# Patient Record
Sex: Male | Born: 1985 | Hispanic: Refuse to answer | Marital: Single | Smoking: Never smoker
Health system: Southern US, Community
[De-identification: ages and names within clinical notes are randomized; demographics above are authoritative.]

---

## 2004-01-13 ENCOUNTER — Emergency Department: Payer: Self-pay | Admitting: Emergency Medicine

## 2016-07-03 ENCOUNTER — Encounter: Payer: Self-pay | Admitting: *Deleted

## 2016-07-03 ENCOUNTER — Ambulatory Visit (INDEPENDENT_AMBULATORY_CARE_PROVIDER_SITE_OTHER): Payer: Self-pay

## 2016-07-03 ENCOUNTER — Ambulatory Visit
Admission: EM | Admit: 2016-07-03 | Discharge: 2016-07-03 | Disposition: A | Discharge status: Home / Self Care | Payer: Self-pay | Attending: Family Medicine | Admitting: Family Medicine

## 2016-07-03 DIAGNOSIS — S62326A Displaced fracture of shaft of fifth metacarpal bone, right hand, initial encounter for closed fracture: Secondary | ICD-10-CM

## 2016-07-03 NOTE — ED Triage Notes (Signed)
Patient injured right hand when he hit it against a step as he was falling backwards 3 days ago. Right Hand is visibly swollen medially.

## 2016-07-03 NOTE — ED Provider Notes (Signed)
CSN: [id number]     Arrival date & time 07/03/16  1232 History   First MD Initiated Contact with Patient 07/03/16 1407     Chief Complaint  Patient presents with  . Hand Injury   (Consider location/radiation/quality/duration/timing/severity/associated sxs/prior Treatment) HPI  This a 31 year old Hispanic male presents with a ten-day history of right ulnar hand pain. He states that he slipped on a step and fell backwards striking his right dominant ulnar hand on the step. He had immediate pain and a large amount of swelling and ecchymosis. 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 He presents today because of continued pain.        History reviewed. No pertinent past medical history. History reviewed. No pertinent surgical history. Family History  Problem Relation Age of Onset  . Cancer Mother   . Diabetes Father    Social History  Substance Use Topics  . Smoking status: Never Smoker  . Smokeless tobacco: Never Used  . Alcohol use Yes    Review of Systems  Constitutional: Negative for activity change, appetite change, chills, fatigue and fever.  Musculoskeletal: Positive for joint swelling.  All other systems reviewed and are negative.   Allergies  Patient has no known allergies.  Home Medications   Prior to Admission medications   Not on File   Meds Ordered and Administered this Visit  Medications - No data to display  BP (!) 110/58 (BP Location: Left Arm)   Pulse (!) 53   Temp 97.9 F (36.6 C) (Oral)   Resp 16   Ht 5\' 7"  (1.702 m)   Wt 190 lb (86.2 kg)   SpO2 99%   BMI 29.76 kg/m  No data found.   Physical Exam  Constitutional: He is oriented to person, place, and time. He appears well-developed and well-nourished. No distress.  HENT:  Head: Normocephalic.  Eyes: Pupils are equal, round, and reactive to light.  Neck: Normal range of motion.  Musculoskeletal: He exhibits edema and  tenderness. He exhibits no deformity.  Examination of the right dominant hand shows swelling over the ulnar mid metacarpal area of the fifth metacarpal. Patient does have a rotational deformity of approximately 45 of the little finger in flexion. Maximal tenderness is the mid shaft of the fifth metacarpal. Wrist range of motion is full and comfortable. Other fingers are not involved.  Neurological: He is alert and oriented to person, place, and time.  Skin: Skin is warm and dry. He is not diaphoretic.  Psychiatric: He has a normal mood and affect. His behavior is normal. Judgment and thought content normal.  Nursing note and vitals reviewed.   Urgent Care Course     Procedures (including critical care time)  Labs Review Labs Reviewed - No data to display  Imaging Review Dg Hand Complete Right  Result Date: 07/03/2016 CLINICAL DATA:  Tripped and fell injuring right hand 3 days ago. EXAM: RIGHT HAND - COMPLETE 3+ VIEW COMPARISON:  None. FINDINGS: Comminuted fracture of the mid fifth metacarpal with 3 mm radial distraction of the distal fragment and apex posterior angulation. No other fracture evident. No subluxation dislocation. IMPRESSION: Comminuted fracture mid fifth metacarpal. Electronically Signed   By: Kennith CenterEric  Mansell M.D.   On: 07/03/2016 13:17     Visual Acuity Review  Right Eye Distance:   Left Eye Distance:   Bilateral Distance:    Right Eye Near:   Left  Eye Near:    Bilateral Near:     Patient was placed in a gutter splint.    MDM   1. Closed displaced fracture of shaft of fifth metacarpal bone of right hand, initial encounter    Plan: 1. Test/x-ray results and diagnosis reviewed with patient 2. rx as per orders; risks, benefits, potential side effects reviewed with patient 3. Recommend supportive treatment with elevation/ice as necessary to control swelling. Patient will need to follow-up with a hand surgeon for possible surgery. He was given the address and phone  number for [facility] hand surgery. If he cannot be seen in a timely fashion he will need to go to the emergency department for evaluation and treatment. I recommended Motrin for pain. 4. F/u prn if symptoms worsen or don't improve     Lutricia Feil, PA-C 07/03/16 1511

## 2016-10-06 ENCOUNTER — Ambulatory Visit
Admission: EM | Admit: 2016-10-06 | Discharge: 2016-10-06 | Disposition: A | Discharge status: Home / Self Care | Payer: Self-pay | Attending: Family Medicine | Admitting: Family Medicine

## 2016-10-06 ENCOUNTER — Ambulatory Visit (INDEPENDENT_AMBULATORY_CARE_PROVIDER_SITE_OTHER): Payer: Self-pay

## 2016-10-06 ENCOUNTER — Encounter: Payer: Self-pay | Admitting: *Deleted

## 2016-10-06 DIAGNOSIS — M25571 Pain in right ankle and joints of right foot: Secondary | ICD-10-CM

## 2016-10-06 DIAGNOSIS — S92124A Nondisplaced fracture of body of right talus, initial encounter for closed fracture: Secondary | ICD-10-CM

## 2016-10-06 DIAGNOSIS — S8001XA Contusion of right knee, initial encounter: Secondary | ICD-10-CM

## 2016-10-06 DIAGNOSIS — M79604 Pain in right leg: Secondary | ICD-10-CM

## 2016-10-06 MED ORDER — NAPROXEN 500 MG PO TABS: 500.0000 mg | ORAL_TABLET | Freq: Two times a day (BID) | ORAL | 0 refills | Status: AC | PRN

## 2016-10-06 NOTE — ED Provider Notes (Signed)
[facility]    CSN: [id number] Arrival date & time: 10/06/16  1249     History   Chief Complaint Chief Complaint  Patient presents with  . Ankle Injury    HPI Anthony Spence Anthony Spence is a 31 y.o. male.   31 year old male presents with right leg and ankle injury that occurred 3 days ago. He was working on the 2nd floor of a house when he fell through the rafters and landed on his right leg and foot. He has swelling and bruising from medial aspect of knee to medial aspect of ankle. He has not applied any ice or taken any medication for pain or swelling. He continued working after his injury. No previous injuries or surgeries on right leg/foot. He is concerned over a fracture since his pain continues and has significant bruising. No other chronic health issues. Takes no daily medication.    The history is provided by the patient.    History reviewed. No pertinent past medical history.  There are no active problems to display for this patient.   History reviewed. No pertinent surgical history.     Home Medications    Prior to Admission medications   Medication Sig Start Date End Date Taking? Authorizing Provider  naproxen (NAPROSYN) 500 MG tablet Take 1 tablet (500 mg total) by mouth 2 (two) times daily as needed for moderate pain. 10/06/16   Sudie Grumbling, NP    Family History Family History  Problem Relation Age of Onset  . Cancer Mother   . Diabetes Father     Social History Social History  Substance Use Topics  . Smoking status: Never Smoker  . Smokeless tobacco: Never Used  . Alcohol use Yes     Allergies   Patient has no known allergies.   Review of Systems Review of Systems  Constitutional: Negative for activity change, appetite change, chills, fatigue and fever.  Eyes: Negative for photophobia, pain and visual disturbance.  Gastrointestinal: Negative for abdominal pain, diarrhea, nausea and vomiting.  Genitourinary: Negative for decreased  urine volume and difficulty urinating.  Musculoskeletal: Positive for arthralgias, joint swelling and myalgias. Negative for back pain and neck pain.  Skin: Positive for color change. Negative for rash and wound.  Allergic/Immunologic: Negative for immunocompromised state.  Neurological: Negative for dizziness, tremors, seizures, syncope, weakness, light-headedness, numbness and headaches.  Hematological: Negative for adenopathy. Does not bruise/bleed easily.     Physical Exam Triage Vital Signs ED Triage Vitals  Enc Vitals Group     BP 10/06/16 1305 120/67     Pulse Rate 10/06/16 1305 (!) 56     Resp 10/06/16 1305 16     Temp 10/06/16 1305 98.2 F (36.8 C)     Temp Source 10/06/16 1305 Oral     SpO2 10/06/16 1305 99 %     Weight 10/06/16 1306 180 lb (81.6 kg)     Height 10/06/16 1306  (1.702 m)     Head Circumference --      Peak Flow --      Pain Score 10/06/16 1308 5     Pain Loc --      Pain Edu? --      Excl. in GC? --    No data found.   Updated Vital Signs BP 120/67 (BP Location: Left Arm)   Pulse (!) 56   Temp 98.2 F (36.8 C) (Oral)   Resp 16   Ht  (1.702 m)  Wt 180 lb (81.6 kg)   SpO2 99%   BMI 28.19 kg/m   Visual Acuity Right Eye Distance:   Left Eye Distance:   Bilateral Distance:    Right Eye Near:   Left Eye Near:    Bilateral Near:     Physical Exam  Constitutional: He is oriented to person, place, and time. He appears well-developed and well-nourished. No distress.  HENT:  Head: Normocephalic and atraumatic.  Eyes: Conjunctivae and EOM are normal.  Neck: Normal range of motion. Neck supple.  Cardiovascular: Normal heart sounds.  Bradycardia present.   Pulmonary/Chest: Effort normal.  Musculoskeletal: He exhibits edema and tenderness.       Right knee: He exhibits swelling and ecchymosis. He exhibits normal range of motion, no deformity, no laceration, no erythema, normal alignment, no LCL laxity and normal patellar mobility.  Tenderness found. Medial joint line tenderness noted.       Right ankle: He exhibits swelling and ecchymosis. He exhibits normal range of motion, no deformity, no laceration and normal pulse. Tenderness. Medial malleolus tenderness found. Achilles tendon normal.       Legs:      Feet:  Right medial aspect of knee with red to purple ecchymosis present and tender. Has full range of motion of knee and leg. Tender and slight pink ecchymosis present mid-way down Tibia.  Has full range of motion of right ankle/foot but pain with flexion and rotation. Very swollen around medial malleolus and significant ecchymosis present below malleolus. Tender over dorsal aspect and medial aspect of foot. Good pulses and capillary refill. No neuro deficits noted.   Neurological: He is alert and oriented to person, place, and time. He has normal strength. No sensory deficit.  Skin: Skin is warm, dry and intact. Capillary refill takes less than 2 seconds. Bruising and ecchymosis noted. There is erythema.  Psychiatric: He has a normal mood and affect. His behavior is normal. Judgment and thought content normal.     UC Treatments / Results  Labs (all labs ordered are listed, but only abnormal results are displayed) Labs Reviewed - No data to display  EKG  EKG Interpretation None       Radiology Dg Tibia/fibula Right  Result Date: 10/06/2016 CLINICAL DATA:  Right lower leg pain.  Status post fall. EXAM: RIGHT TIBIA AND FIBULA - 2 VIEW COMPARISON:  None. FINDINGS: There is no evidence of fracture or other focal bone lesions. Soft tissues are unremarkable. IMPRESSION: No acute osseous injury of the right tibia or fibula. Electronically Signed   By: Elige KoHetal  Patel   On: 10/06/2016 13:58   Dg Ankle Complete Right  Result Date: 10/06/2016 CLINICAL DATA:  Right lower leg status post fall. EXAM: RIGHT ANKLE - COMPLETE 3+ VIEW COMPARISON:  None. FINDINGS: There is a subtle linear lucency along the periphery of the lateral  talus concerning for a nondisplaced fracture. There is no evidence of arthropathy or other focal bone abnormality. Soft tissue swelling around the ankle. IMPRESSION: Subtle linear lucency along the periphery of the lateral talus concerning for a nondisplaced fracture. No other acute fracture or dislocation. Electronically Signed   By: Elige KoHetal  Patel   On: 10/06/2016 14:01    Procedures Procedures (including critical care time)  Medications Ordered in UC Medications - No data to display   Initial Impression / Assessment and Plan / UC Course  I have reviewed the triage vital signs and the nursing notes.  Pertinent labs & imaging results that were available during  my care of the patient were reviewed by me and considered in my medical decision making (see chart for details).    Reviewed x-ray results with patient- no fracture of leg. Possible Small non-displaced fracture of Talus bone of right foot. Recommend wear ace wrap and Ortho boot for support. Keep foot elevated as much as possible. Take Naproxen  twice a day as needed for pain and swelling. Since this injury occurred at work- patient needs to follow-up with Occupational Health to be referred to [facility] or Orthopedic for management. Recommend follow-up with Tommie Maximiano Coss, NP tomorrow for further evaluation and probable referral to Podiatrist for further evaluation and treatment.   Final Clinical Impressions(s) / UC Diagnoses   Final diagnoses:  Closed nondisplaced fracture of body of right talus, initial encounter  Contusion of right knee, initial encounter  Leg pain, inferior, right  Acute right ankle pain    New Prescriptions Discharge Medication List as of 10/06/2016  2:34 PM    START taking these medications   Details  naproxen (NAPROSYN) 500 MG tablet Take 1 tablet (500 mg total) by mouth 2 (two) times daily as needed for moderate pain., Starting Sun 10/06/2016, Normal         Controlled Substance  Prescriptions [place] Controlled Substance Registry consulted? No   Sudie Grumbling, NP 10/06/16 [phone]

## 2016-10-06 NOTE — ED Triage Notes (Signed)
Patient injured his right ankle, right lower leg, and right knee 3 days ago when he fell through rafters on while working on the second floor. No previous surgeries on right leg or ankle.

## 2016-10-06 NOTE — Discharge Instructions (Signed)
Recommend wear ace wrap and boot on right foot for support. Keep foot elevated as much as possible. Take Naproxen 500mg  twice a day as needed for pain and swelling. Follow up with Tommie Maximiano CossAnne Moore, NP- Occupational Health tomorrow for referral to Podiatrist (Foot doctor) for further evaluation.

## 2018-09-28 IMAGING — CR DG ANKLE COMPLETE 3+V*R*
3 series · 3 of 3 positions shown · non-contrast
Comparison: None.

CLINICAL DATA: Right lower leg status post fall.

EXAM:
RIGHT ANKLE - COMPLETE 3+ VIEW

[ankle ap]
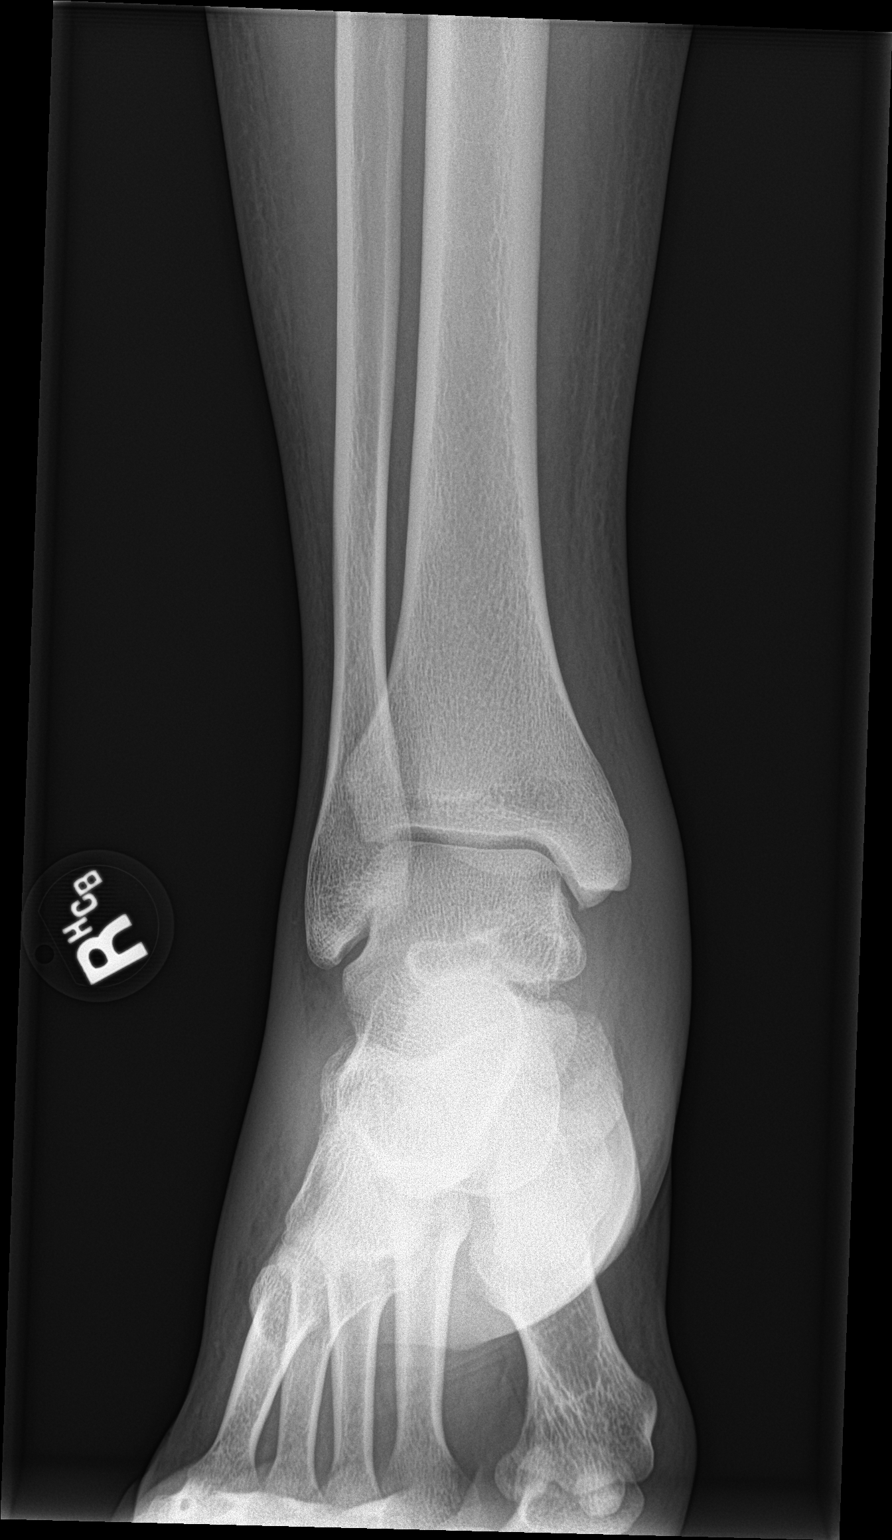

[ankle obl]
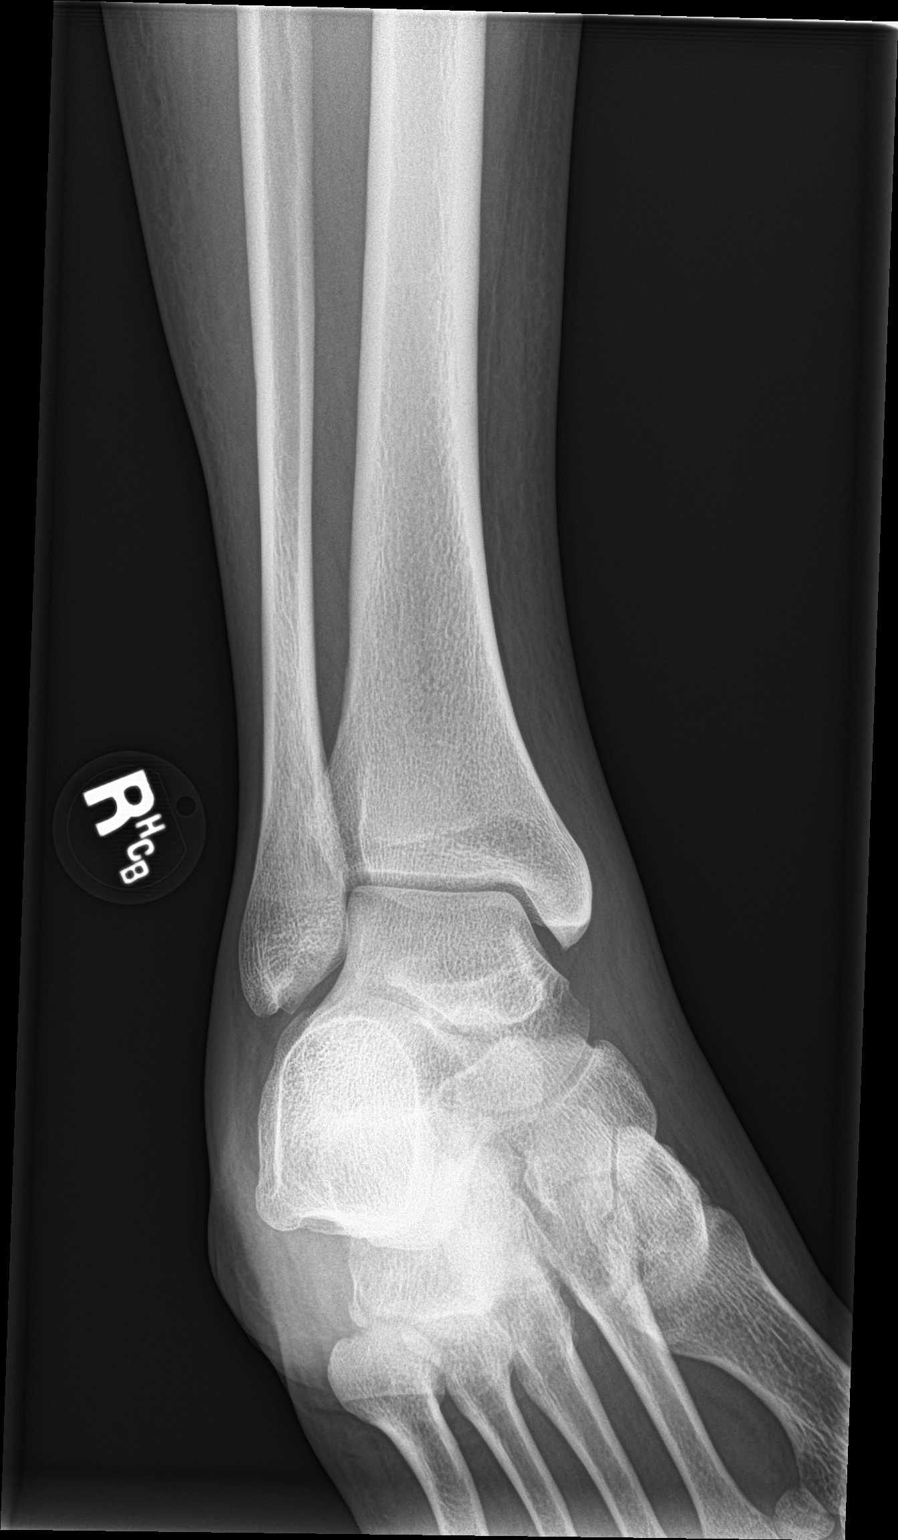

[ankle lat]
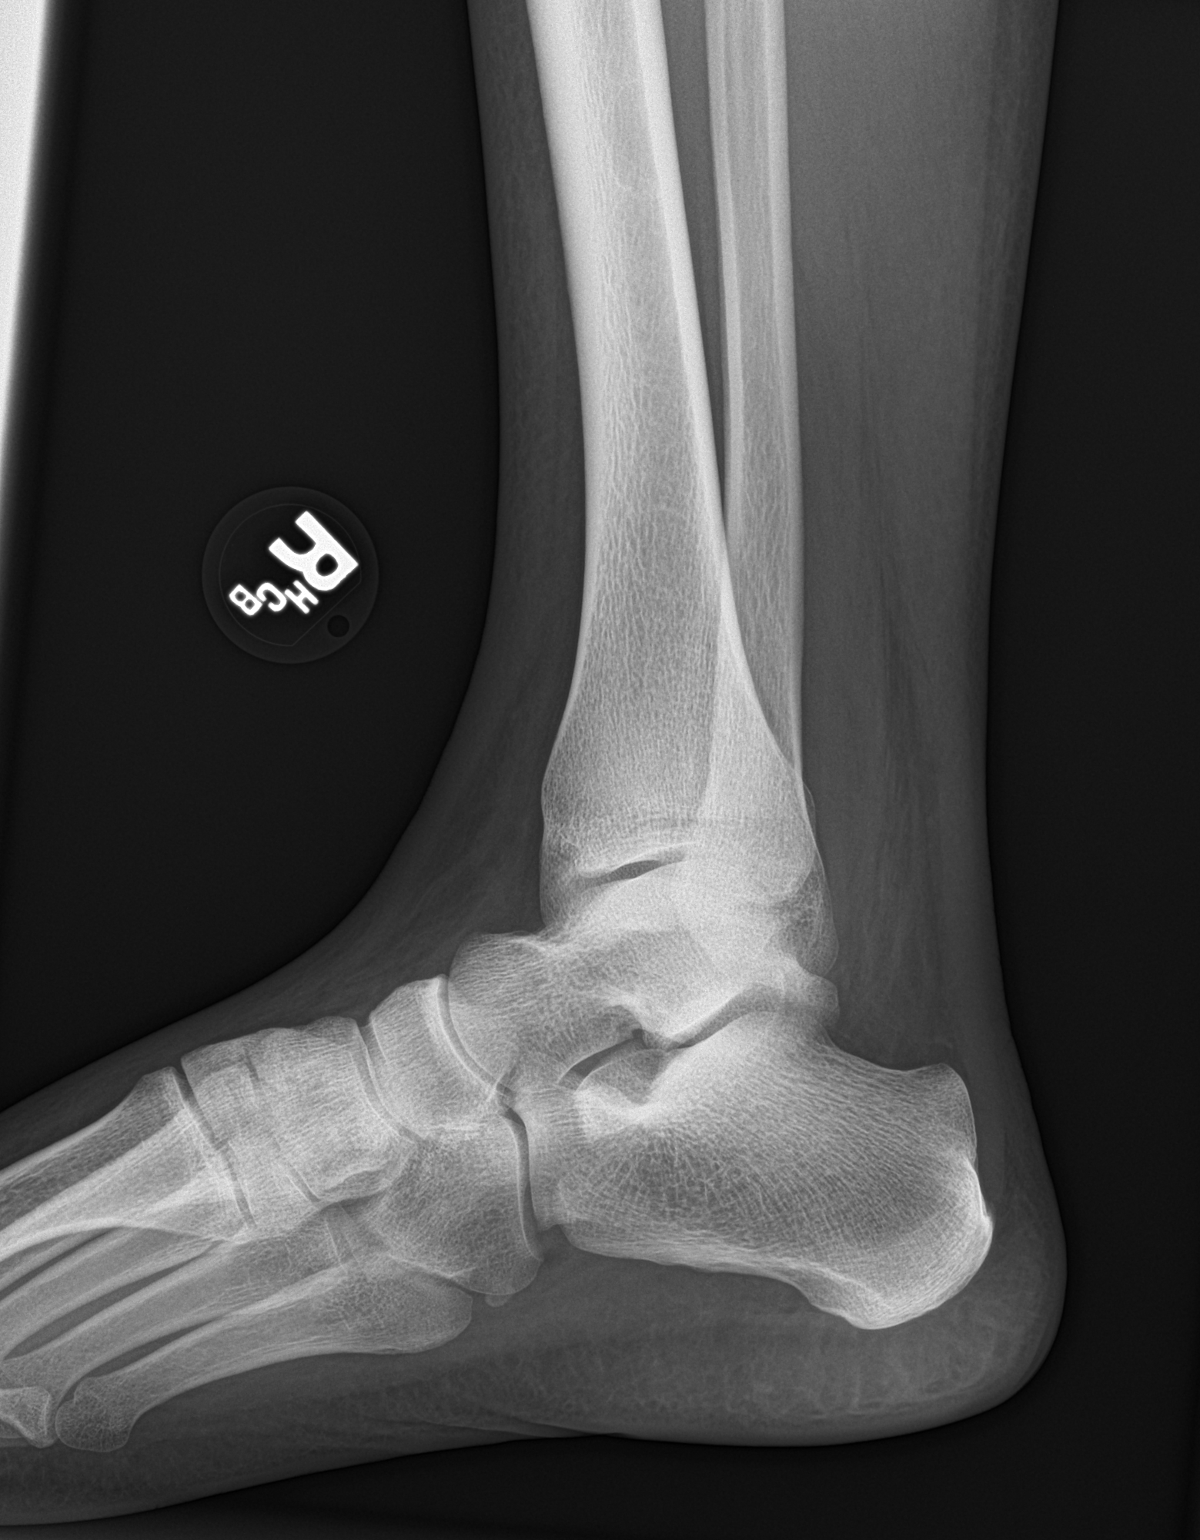

[3 of 3 positions shown; findings below may reference images not displayed]

FINDINGS: There is a subtle linear lucency along the periphery of the lateral
talus concerning for a nondisplaced fracture. There is no evidence
of arthropathy or other focal bone abnormality. Soft tissue swelling
around the ankle.
IMPRESSION: Subtle linear lucency along the periphery of the lateral talus
concerning for a nondisplaced fracture. No other acute fracture or
dislocation.

## 2018-09-28 IMAGING — CR DG TIBIA/FIBULA 2V*R*
2 series · 2 of 2 positions shown · non-contrast
Comparison: None.

CLINICAL DATA: Right lower leg pain.  Status post fall.

EXAM:
RIGHT TIBIA AND FIBULA - 2 VIEW

[tibia ap]
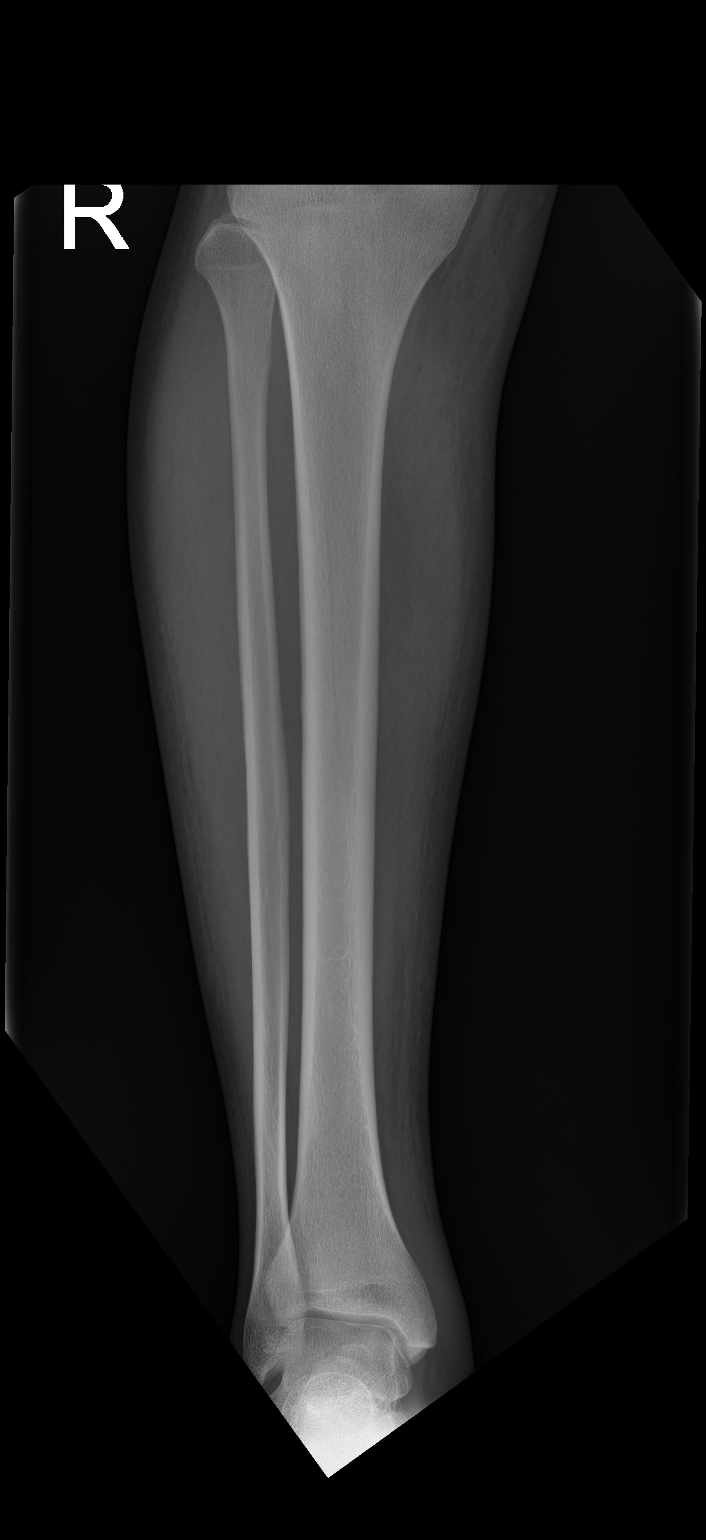

[tibia lat]
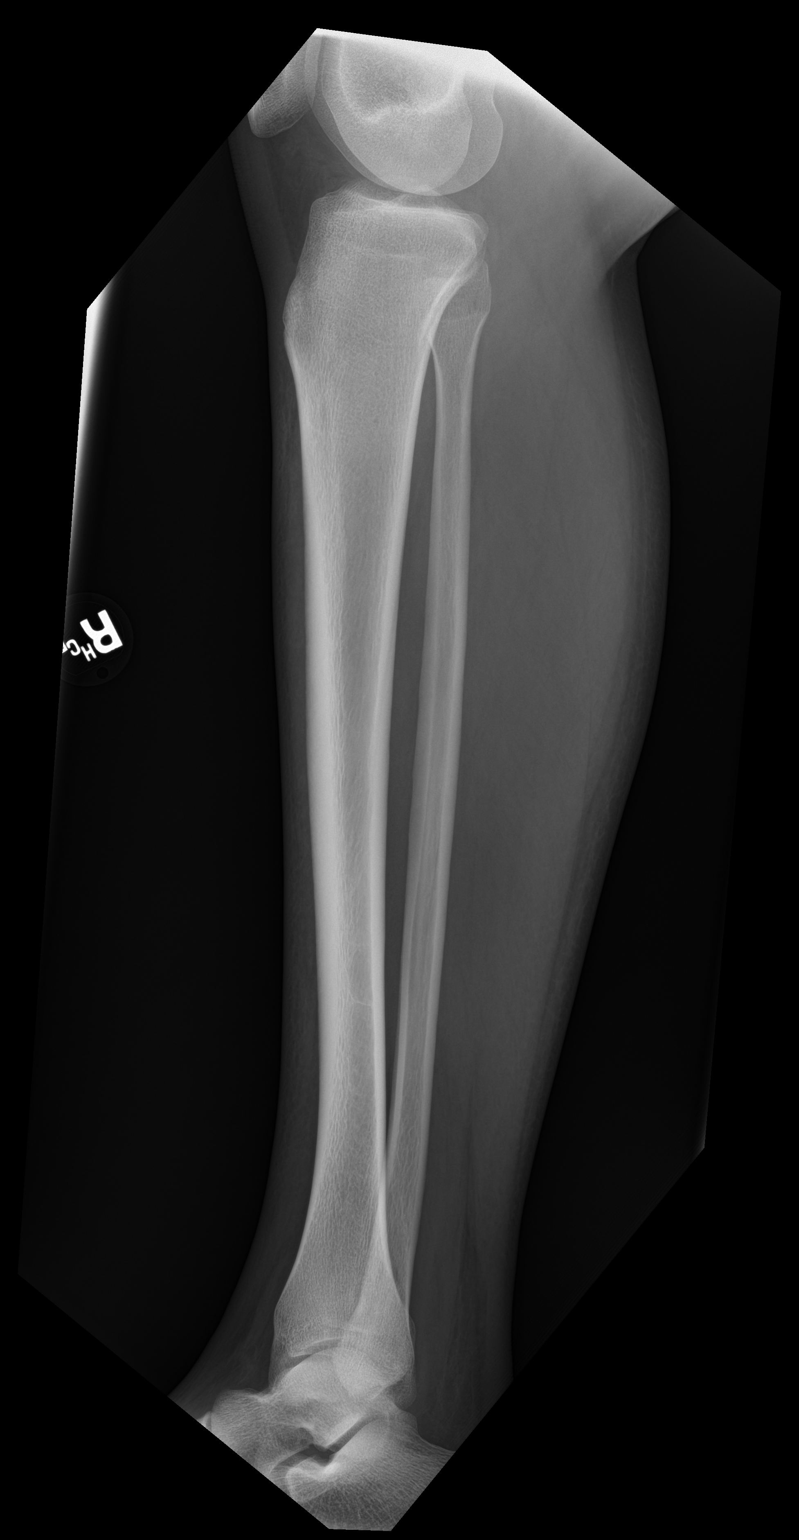

[2 of 2 positions shown; findings below may reference images not displayed]

FINDINGS: There is no evidence of fracture or other focal bone lesions. Soft
tissues are unremarkable.
IMPRESSION: No acute osseous injury of the right tibia or fibula.
